# Patient Record
Sex: Female | Born: 1945 | Race: White | Hispanic: No | Marital: Single | Smoking: Never smoker
Health system: Southern US, Community
[De-identification: ages and names within clinical notes are randomized; demographics above are authoritative.]

## PROBLEM LIST (undated history)

## (undated) DIAGNOSIS — F1027 Alcohol dependence with alcohol-induced persisting dementia: Secondary | ICD-10-CM

---

## 2005-12-14 ENCOUNTER — Ambulatory Visit (HOSPITAL_COMMUNITY): Admission: RE | Admit: 2005-12-14 | Discharge: 2005-12-14 | Payer: Self-pay | Admitting: Ophthalmology

## 2005-12-29 ENCOUNTER — Ambulatory Visit (HOSPITAL_COMMUNITY): Admission: RE | Admit: 2005-12-29 | Discharge: 2005-12-29 | Payer: Self-pay | Admitting: Urology

## 2006-12-29 ENCOUNTER — Emergency Department (HOSPITAL_COMMUNITY): Admission: EM | Admit: 2006-12-29 | Discharge: 2006-12-29 | Payer: Self-pay | Admitting: Emergency Medicine

## 2010-07-10 NOTE — Procedures (Signed)
Katherine Parsons, Katherine Parsons                 ACCOUNT NO.:  [id number]   MEDICAL RECORD NO.:  [id number]          PATIENT TYPE:  AMB   LOCATION:  DAY                           FACILITY:  [facility]   PHYSICIAN:  Edward L. Juanetta Gosling, M.D.DATE OF BIRTH:  May 08, 1945   DATE OF PROCEDURE:  12/10/2005  DATE OF DISCHARGE:                                EKG INTERPRETATION   The rhythm is sinus rhythm with a rate in the 70s.  There is left atrial  enlargement.  There are T-wave abnormalities seen anteriorly mostly, which  could indicate ischemia.  Clinical correlation is suggested.  Abnormal  electrocardiogram.      Oneal Deputy. Juanetta Gosling, M.D.  Electronically Signed     [facility]/MEDQ  D:  12/13/2005  T:  12/13/2005  Job:  [id number]

## 2010-12-01 LAB — I-STAT 8, (EC8 V) (CONVERTED LAB)
Acid-Base Excess: 2
BUN: 4 — ABNORMAL LOW
Chloride: 104
Glucose, Bld: 86
HCT: 43
Operator id: 247071
Potassium: 4
Sodium: 139
TCO2: 29
pCO2, Ven: 46.1
pH, Ven: 7.385 — ABNORMAL HIGH

## 2010-12-01 LAB — CBC
Hemoglobin: 13
MCHC: 33.5
RBC: 3.57 — ABNORMAL LOW
WBC: 4.2

## 2010-12-01 LAB — DIFFERENTIAL
Lymphocytes Relative: 31
Monocytes Absolute: 0.6

## 2015-06-02 DIAGNOSIS — Z79899 Other long term (current) drug therapy: Secondary | ICD-10-CM | POA: Diagnosis not present

## 2015-06-02 DIAGNOSIS — J029 Acute pharyngitis, unspecified: Secondary | ICD-10-CM | POA: Diagnosis not present

## 2015-06-02 DIAGNOSIS — J302 Other seasonal allergic rhinitis: Secondary | ICD-10-CM | POA: Diagnosis not present

## 2015-06-02 DIAGNOSIS — E785 Hyperlipidemia, unspecified: Secondary | ICD-10-CM | POA: Diagnosis not present

## 2015-06-02 DIAGNOSIS — F329 Major depressive disorder, single episode, unspecified: Secondary | ICD-10-CM | POA: Diagnosis not present

## 2015-06-02 DIAGNOSIS — R070 Pain in throat: Secondary | ICD-10-CM | POA: Diagnosis not present

## 2015-09-16 DIAGNOSIS — M21611 Bunion of right foot: Secondary | ICD-10-CM | POA: Diagnosis not present

## 2015-09-16 DIAGNOSIS — Z681 Body mass index (BMI) 19 or less, adult: Secondary | ICD-10-CM | POA: Diagnosis not present

## 2015-09-16 DIAGNOSIS — M21612 Bunion of left foot: Secondary | ICD-10-CM | POA: Diagnosis not present

## 2015-09-19 DIAGNOSIS — Z1211 Encounter for screening for malignant neoplasm of colon: Secondary | ICD-10-CM | POA: Diagnosis not present

## 2015-09-19 DIAGNOSIS — Z1231 Encounter for screening mammogram for malignant neoplasm of breast: Secondary | ICD-10-CM | POA: Diagnosis not present

## 2015-09-19 DIAGNOSIS — Z681 Body mass index (BMI) 19 or less, adult: Secondary | ICD-10-CM | POA: Diagnosis not present

## 2015-09-19 DIAGNOSIS — E7801 Familial hypercholesterolemia: Secondary | ICD-10-CM | POA: Diagnosis not present

## 2015-09-19 DIAGNOSIS — Z0000 Encounter for general adult medical examination without abnormal findings: Secondary | ICD-10-CM | POA: Diagnosis not present

## 2015-09-19 DIAGNOSIS — Z23 Encounter for immunization: Secondary | ICD-10-CM | POA: Diagnosis not present

## 2015-09-19 DIAGNOSIS — I10 Essential (primary) hypertension: Secondary | ICD-10-CM | POA: Diagnosis not present

## 2015-09-19 DIAGNOSIS — Z124 Encounter for screening for malignant neoplasm of cervix: Secondary | ICD-10-CM | POA: Diagnosis not present

## 2015-09-19 DIAGNOSIS — E559 Vitamin D deficiency, unspecified: Secondary | ICD-10-CM | POA: Diagnosis not present

## 2015-09-19 DIAGNOSIS — L719 Rosacea, unspecified: Secondary | ICD-10-CM | POA: Diagnosis not present

## 2015-09-29 DIAGNOSIS — M71572 Other bursitis, not elsewhere classified, left ankle and foot: Secondary | ICD-10-CM | POA: Diagnosis not present

## 2015-09-29 DIAGNOSIS — M79672 Pain in left foot: Secondary | ICD-10-CM | POA: Diagnosis not present

## 2015-09-29 DIAGNOSIS — M71571 Other bursitis, not elsewhere classified, right ankle and foot: Secondary | ICD-10-CM | POA: Diagnosis not present

## 2015-09-29 DIAGNOSIS — M79671 Pain in right foot: Secondary | ICD-10-CM | POA: Diagnosis not present

## 2015-09-29 DIAGNOSIS — M8700 Idiopathic aseptic necrosis of unspecified bone: Secondary | ICD-10-CM | POA: Diagnosis not present

## 2015-10-20 DIAGNOSIS — Z23 Encounter for immunization: Secondary | ICD-10-CM | POA: Diagnosis not present

## 2016-12-17 DIAGNOSIS — Z299 Encounter for prophylactic measures, unspecified: Secondary | ICD-10-CM | POA: Diagnosis not present

## 2016-12-17 DIAGNOSIS — F0280 Dementia in other diseases classified elsewhere without behavioral disturbance: Secondary | ICD-10-CM | POA: Diagnosis not present

## 2016-12-17 DIAGNOSIS — Z23 Encounter for immunization: Secondary | ICD-10-CM | POA: Diagnosis not present

## 2016-12-17 DIAGNOSIS — R32 Unspecified urinary incontinence: Secondary | ICD-10-CM | POA: Diagnosis not present

## 2016-12-17 DIAGNOSIS — G4700 Insomnia, unspecified: Secondary | ICD-10-CM | POA: Diagnosis not present

## 2016-12-17 DIAGNOSIS — F419 Anxiety disorder, unspecified: Secondary | ICD-10-CM | POA: Diagnosis not present

## 2016-12-17 DIAGNOSIS — Z681 Body mass index (BMI) 19 or less, adult: Secondary | ICD-10-CM | POA: Diagnosis not present

## 2016-12-17 DIAGNOSIS — G309 Alzheimer's disease, unspecified: Secondary | ICD-10-CM | POA: Diagnosis not present

## 2016-12-17 DIAGNOSIS — H9190 Unspecified hearing loss, unspecified ear: Secondary | ICD-10-CM | POA: Diagnosis not present

## 2016-12-17 DIAGNOSIS — Z789 Other specified health status: Secondary | ICD-10-CM | POA: Diagnosis not present

## 2016-12-17 DIAGNOSIS — E7800 Pure hypercholesterolemia, unspecified: Secondary | ICD-10-CM | POA: Diagnosis not present

## 2017-01-07 DIAGNOSIS — G309 Alzheimer's disease, unspecified: Secondary | ICD-10-CM | POA: Diagnosis not present

## 2017-01-07 DIAGNOSIS — R5383 Other fatigue: Secondary | ICD-10-CM | POA: Diagnosis not present

## 2017-01-07 DIAGNOSIS — Z1211 Encounter for screening for malignant neoplasm of colon: Secondary | ICD-10-CM | POA: Diagnosis not present

## 2017-01-07 DIAGNOSIS — Z299 Encounter for prophylactic measures, unspecified: Secondary | ICD-10-CM | POA: Diagnosis not present

## 2017-01-07 DIAGNOSIS — Z681 Body mass index (BMI) 19 or less, adult: Secondary | ICD-10-CM | POA: Diagnosis not present

## 2017-01-07 DIAGNOSIS — Z1331 Encounter for screening for depression: Secondary | ICD-10-CM | POA: Diagnosis not present

## 2017-01-07 DIAGNOSIS — E7800 Pure hypercholesterolemia, unspecified: Secondary | ICD-10-CM | POA: Diagnosis not present

## 2017-01-07 DIAGNOSIS — Z1339 Encounter for screening examination for other mental health and behavioral disorders: Secondary | ICD-10-CM | POA: Diagnosis not present

## 2017-01-07 DIAGNOSIS — F419 Anxiety disorder, unspecified: Secondary | ICD-10-CM | POA: Diagnosis not present

## 2017-01-07 DIAGNOSIS — Z0000 Encounter for general adult medical examination without abnormal findings: Secondary | ICD-10-CM | POA: Diagnosis not present

## 2017-01-07 DIAGNOSIS — Z7189 Other specified counseling: Secondary | ICD-10-CM | POA: Diagnosis not present

## 2017-01-07 DIAGNOSIS — F0280 Dementia in other diseases classified elsewhere without behavioral disturbance: Secondary | ICD-10-CM | POA: Diagnosis not present

## 2017-02-10 DIAGNOSIS — E2839 Other primary ovarian failure: Secondary | ICD-10-CM | POA: Diagnosis not present

## 2017-03-01 DIAGNOSIS — F0280 Dementia in other diseases classified elsewhere without behavioral disturbance: Secondary | ICD-10-CM | POA: Diagnosis not present

## 2017-03-01 DIAGNOSIS — Z299 Encounter for prophylactic measures, unspecified: Secondary | ICD-10-CM | POA: Diagnosis not present

## 2017-03-01 DIAGNOSIS — F419 Anxiety disorder, unspecified: Secondary | ICD-10-CM | POA: Diagnosis not present

## 2017-03-01 DIAGNOSIS — Z681 Body mass index (BMI) 19 or less, adult: Secondary | ICD-10-CM | POA: Diagnosis not present

## 2017-03-01 DIAGNOSIS — G309 Alzheimer's disease, unspecified: Secondary | ICD-10-CM | POA: Diagnosis not present

## 2017-03-01 DIAGNOSIS — Z87891 Personal history of nicotine dependence: Secondary | ICD-10-CM | POA: Diagnosis not present

## 2017-03-01 DIAGNOSIS — M810 Age-related osteoporosis without current pathological fracture: Secondary | ICD-10-CM | POA: Diagnosis not present

## 2017-04-29 DIAGNOSIS — R32 Unspecified urinary incontinence: Secondary | ICD-10-CM | POA: Diagnosis not present

## 2017-04-29 DIAGNOSIS — Z299 Encounter for prophylactic measures, unspecified: Secondary | ICD-10-CM | POA: Diagnosis not present

## 2017-04-29 DIAGNOSIS — Z681 Body mass index (BMI) 19 or less, adult: Secondary | ICD-10-CM | POA: Diagnosis not present

## 2017-04-29 DIAGNOSIS — M810 Age-related osteoporosis without current pathological fracture: Secondary | ICD-10-CM | POA: Diagnosis not present

## 2017-04-29 DIAGNOSIS — Z789 Other specified health status: Secondary | ICD-10-CM | POA: Diagnosis not present

## 2017-04-29 DIAGNOSIS — F0280 Dementia in other diseases classified elsewhere without behavioral disturbance: Secondary | ICD-10-CM | POA: Diagnosis not present

## 2017-04-29 DIAGNOSIS — G309 Alzheimer's disease, unspecified: Secondary | ICD-10-CM | POA: Diagnosis not present

## 2017-07-12 DIAGNOSIS — Z299 Encounter for prophylactic measures, unspecified: Secondary | ICD-10-CM | POA: Diagnosis not present

## 2017-07-12 DIAGNOSIS — G309 Alzheimer's disease, unspecified: Secondary | ICD-10-CM | POA: Diagnosis not present

## 2017-07-12 DIAGNOSIS — F0280 Dementia in other diseases classified elsewhere without behavioral disturbance: Secondary | ICD-10-CM | POA: Diagnosis not present

## 2017-07-12 DIAGNOSIS — E7800 Pure hypercholesterolemia, unspecified: Secondary | ICD-10-CM | POA: Diagnosis not present

## 2017-07-12 DIAGNOSIS — M545 Low back pain: Secondary | ICD-10-CM | POA: Diagnosis not present

## 2017-07-12 DIAGNOSIS — N390 Urinary tract infection, site not specified: Secondary | ICD-10-CM | POA: Diagnosis not present

## 2017-07-12 DIAGNOSIS — Z681 Body mass index (BMI) 19 or less, adult: Secondary | ICD-10-CM | POA: Diagnosis not present

## 2017-08-24 DIAGNOSIS — F0280 Dementia in other diseases classified elsewhere without behavioral disturbance: Secondary | ICD-10-CM | POA: Diagnosis not present

## 2017-08-24 DIAGNOSIS — R32 Unspecified urinary incontinence: Secondary | ICD-10-CM | POA: Diagnosis not present

## 2017-08-24 DIAGNOSIS — Z681 Body mass index (BMI) 19 or less, adult: Secondary | ICD-10-CM | POA: Diagnosis not present

## 2017-08-24 DIAGNOSIS — Z299 Encounter for prophylactic measures, unspecified: Secondary | ICD-10-CM | POA: Diagnosis not present

## 2017-08-24 DIAGNOSIS — G309 Alzheimer's disease, unspecified: Secondary | ICD-10-CM | POA: Diagnosis not present

## 2017-08-24 DIAGNOSIS — L84 Corns and callosities: Secondary | ICD-10-CM | POA: Diagnosis not present

## 2017-09-02 ENCOUNTER — Other Ambulatory Visit: Payer: Self-pay

## 2017-09-02 ENCOUNTER — Encounter (HOSPITAL_COMMUNITY): Payer: Self-pay | Admitting: Emergency Medicine

## 2017-09-02 ENCOUNTER — Emergency Department (HOSPITAL_COMMUNITY)
Admission: EM | Admit: 2017-09-02 | Discharge: 2017-09-02 | Disposition: A | Discharge status: Home / Self Care | Payer: Medicare Other | Attending: Emergency Medicine | Admitting: Emergency Medicine

## 2017-09-02 DIAGNOSIS — M2040 Other hammer toe(s) (acquired), unspecified foot: Secondary | ICD-10-CM | POA: Diagnosis not present

## 2017-09-02 DIAGNOSIS — F1027 Alcohol dependence with alcohol-induced persisting dementia: Secondary | ICD-10-CM | POA: Insufficient documentation

## 2017-09-02 DIAGNOSIS — M79675 Pain in left toe(s): Secondary | ICD-10-CM | POA: Diagnosis not present

## 2017-09-02 HISTORY — DX: Alcohol dependence with alcohol-induced persisting dementia: F10.27

## 2017-09-02 MED ORDER — NAPROXEN 375 MG PO TABS: 375.0000 mg | ORAL_TABLET | Freq: Two times a day (BID) | ORAL | 0 refills | Status: AC

## 2017-09-02 NOTE — Discharge Instructions (Addendum)
Call one of the foot doctors listed to arrange a follow-up appt.  Keep your toes bandaged

## 2017-09-02 NOTE — ED Triage Notes (Signed)
Pt c/o of "calus" between toes on left foot x 2 weeks

## 2017-09-02 NOTE — ED Notes (Signed)
Area cushioned with abd pad.

## 2017-09-04 NOTE — ED Provider Notes (Signed)
[facility] EMERGENCY DEPARTMENT Provider Note   CSN: [id number] Arrival date & time: 09/02/17  1901     History   Chief Complaint Chief Complaint  Patient presents with  . Toe Pain    HPI Katherine Parsons is a 72 y.o. female.  HPI  Katherine Parsons is a 72 y.o. female who presents to the Emergency Department complaining of pain to her left great and second toes. Symptoms present for 2 weeks and worse with weight bearing.  She was seen by her PCP for same and referred to a podiatrist, but patient's daughter brought her to the ER requesting pain control until her mother's appointment.    Past Medical History:  Diagnosis Date  . 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   History reviewed. No pertinent surgical history.   OB History   None      Home Medications    Prior to Admission medications   Medication Sig Start Date End Date Taking? Authorizing Provider  naproxen (NAPROSYN) 375 MG tablet Take 1 tablet (375 mg total) by mouth 2 (two) times daily. Take with food 09/02/17   Pauline Ausriplett, Ilea Hilton, PA-C    Family History History reviewed. No pertinent family history.  Social History Social History   Tobacco Use  . Smoking status: Never Smoker  . Smokeless tobacco: Never Used  Substance Use Topics  . Alcohol use: Not Currently  . Drug use: Not Currently     Allergies   Patient has no known allergies.   Review of Systems Review of Systems  Constitutional: Negative for chills and fever.  Musculoskeletal: Positive for arthralgias. Negative for joint swelling.  Skin: Negative for color change and wound.  Neurological: Negative for weakness and numbness.     Physical Exam Updated Vital Signs BP 131/84 (BP Location: Right Arm)   Pulse 76   Temp 98.5 F (36.9 C) (Temporal)   Resp 18   Ht 5\' 3"  (1.6 m)   Wt 48.1 kg (106 lb)   SpO2 100%    BMI 18.78 kg/m   Physical Exam  Constitutional: She appears well-developed and well-nourished. No distress.  HENT:  Head: Atraumatic.  Cardiovascular: Normal rate, regular rhythm and intact distal pulses.  Pulmonary/Chest: Effort normal and breath sounds normal. No respiratory distress.  Musculoskeletal: Normal range of motion.  1 cm flesh colored, hyperkeratotic papule to the dorsal left second toe. Great toe is overlapping second toe.  No edema or erythema of either toe.  Hammer toes of second, third and fourth toes.   Neurological: She is alert. No sensory deficit.  Skin: Skin is warm. Capillary refill takes less than 2 seconds. No rash noted. No erythema.  Nursing note and vitals reviewed.    ED Treatments / Results  Labs (all labs ordered are listed, but only abnormal results are displayed) Labs Reviewed - No data to display  EKG None  Radiology No results found.  Procedures Procedures (including critical care time)  Medications Ordered in ED Medications - No data to display   Initial Impression / Assessment and Plan / ED Course  I have reviewed the triage vital signs and the nursing notes.  Pertinent labs & imaging results that were available during my care of the patient were reviewed by me and considered in my medical decision making (see chart for details).     hyperkeratotic papule to dorsal  second toe likely related to friction from overlapped toes.  No concerning sx's for infection, no open wounds or erythema.  NV intact.    Toes padded and taped.  Pt also provided referral to local podiatry.    Final Clinical Impressions(s) / ED Diagnoses   Final diagnoses:  Toe pain, left    ED Discharge Orders        Ordered    naproxen (NAPROSYN) 375 MG tablet  2 times daily     09/02/17 1943       Pauline Aus, PA-C 09/04/17 1943    Samuel Jester, DO 09/07/17 [phone]

## 2017-09-22 DIAGNOSIS — M79675 Pain in left toe(s): Secondary | ICD-10-CM | POA: Diagnosis not present

## 2017-09-22 DIAGNOSIS — L89893 Pressure ulcer of other site, stage 3: Secondary | ICD-10-CM | POA: Diagnosis not present

## 2017-10-25 DIAGNOSIS — Z681 Body mass index (BMI) 19 or less, adult: Secondary | ICD-10-CM | POA: Diagnosis not present

## 2017-10-25 DIAGNOSIS — Z299 Encounter for prophylactic measures, unspecified: Secondary | ICD-10-CM | POA: Diagnosis not present

## 2017-10-25 DIAGNOSIS — F0280 Dementia in other diseases classified elsewhere without behavioral disturbance: Secondary | ICD-10-CM | POA: Diagnosis not present

## 2017-10-25 DIAGNOSIS — G309 Alzheimer's disease, unspecified: Secondary | ICD-10-CM | POA: Diagnosis not present

## 2017-10-25 DIAGNOSIS — E7800 Pure hypercholesterolemia, unspecified: Secondary | ICD-10-CM | POA: Diagnosis not present

## 2017-10-25 DIAGNOSIS — F419 Anxiety disorder, unspecified: Secondary | ICD-10-CM | POA: Diagnosis not present

## 2017-10-27 DIAGNOSIS — E1140 Type 2 diabetes mellitus with diabetic neuropathy, unspecified: Secondary | ICD-10-CM | POA: Diagnosis not present

## 2017-10-27 DIAGNOSIS — L89892 Pressure ulcer of other site, stage 2: Secondary | ICD-10-CM | POA: Diagnosis not present

## 2018-01-11 DIAGNOSIS — R5383 Other fatigue: Secondary | ICD-10-CM | POA: Diagnosis not present

## 2018-01-11 DIAGNOSIS — Z681 Body mass index (BMI) 19 or less, adult: Secondary | ICD-10-CM | POA: Diagnosis not present

## 2018-01-11 DIAGNOSIS — Z1211 Encounter for screening for malignant neoplasm of colon: Secondary | ICD-10-CM | POA: Diagnosis not present

## 2018-01-11 DIAGNOSIS — Z1331 Encounter for screening for depression: Secondary | ICD-10-CM | POA: Diagnosis not present

## 2018-01-11 DIAGNOSIS — Z299 Encounter for prophylactic measures, unspecified: Secondary | ICD-10-CM | POA: Diagnosis not present

## 2018-01-11 DIAGNOSIS — E7800 Pure hypercholesterolemia, unspecified: Secondary | ICD-10-CM | POA: Diagnosis not present

## 2018-01-11 DIAGNOSIS — E559 Vitamin D deficiency, unspecified: Secondary | ICD-10-CM | POA: Diagnosis not present

## 2018-01-11 DIAGNOSIS — Z7189 Other specified counseling: Secondary | ICD-10-CM | POA: Diagnosis not present

## 2018-01-11 DIAGNOSIS — Z79899 Other long term (current) drug therapy: Secondary | ICD-10-CM | POA: Diagnosis not present

## 2018-01-11 DIAGNOSIS — Z0000 Encounter for general adult medical examination without abnormal findings: Secondary | ICD-10-CM | POA: Diagnosis not present

## 2018-01-11 DIAGNOSIS — Z1339 Encounter for screening examination for other mental health and behavioral disorders: Secondary | ICD-10-CM | POA: Diagnosis not present

## 2018-01-11 DIAGNOSIS — K5900 Constipation, unspecified: Secondary | ICD-10-CM | POA: Diagnosis not present

## 2018-03-15 DIAGNOSIS — G309 Alzheimer's disease, unspecified: Secondary | ICD-10-CM | POA: Diagnosis not present

## 2018-03-15 DIAGNOSIS — Z789 Other specified health status: Secondary | ICD-10-CM | POA: Diagnosis not present

## 2018-03-15 DIAGNOSIS — Z681 Body mass index (BMI) 19 or less, adult: Secondary | ICD-10-CM | POA: Diagnosis not present

## 2018-03-15 DIAGNOSIS — F0280 Dementia in other diseases classified elsewhere without behavioral disturbance: Secondary | ICD-10-CM | POA: Diagnosis not present

## 2018-03-15 DIAGNOSIS — Z2821 Immunization not carried out because of patient refusal: Secondary | ICD-10-CM | POA: Diagnosis not present

## 2018-03-15 DIAGNOSIS — I739 Peripheral vascular disease, unspecified: Secondary | ICD-10-CM | POA: Diagnosis not present

## 2018-03-15 DIAGNOSIS — Z299 Encounter for prophylactic measures, unspecified: Secondary | ICD-10-CM | POA: Diagnosis not present

## 2018-03-15 DIAGNOSIS — E7800 Pure hypercholesterolemia, unspecified: Secondary | ICD-10-CM | POA: Diagnosis not present

## 2018-04-03 DIAGNOSIS — I70203 Unspecified atherosclerosis of native arteries of extremities, bilateral legs: Secondary | ICD-10-CM | POA: Diagnosis not present

## 2018-04-03 DIAGNOSIS — I70211 Atherosclerosis of native arteries of extremities with intermittent claudication, right leg: Secondary | ICD-10-CM | POA: Diagnosis not present

## 2018-04-17 DIAGNOSIS — F0280 Dementia in other diseases classified elsewhere without behavioral disturbance: Secondary | ICD-10-CM | POA: Diagnosis not present

## 2018-04-17 DIAGNOSIS — G309 Alzheimer's disease, unspecified: Secondary | ICD-10-CM | POA: Diagnosis not present

## 2018-04-17 DIAGNOSIS — E7800 Pure hypercholesterolemia, unspecified: Secondary | ICD-10-CM | POA: Diagnosis not present

## 2018-04-17 DIAGNOSIS — Z681 Body mass index (BMI) 19 or less, adult: Secondary | ICD-10-CM | POA: Diagnosis not present

## 2018-04-17 DIAGNOSIS — I739 Peripheral vascular disease, unspecified: Secondary | ICD-10-CM | POA: Diagnosis not present

## 2018-04-17 DIAGNOSIS — Z299 Encounter for prophylactic measures, unspecified: Secondary | ICD-10-CM | POA: Diagnosis not present

## 2018-05-25 DIAGNOSIS — Z299 Encounter for prophylactic measures, unspecified: Secondary | ICD-10-CM | POA: Diagnosis not present

## 2018-05-25 DIAGNOSIS — G309 Alzheimer's disease, unspecified: Secondary | ICD-10-CM | POA: Diagnosis not present

## 2018-05-25 DIAGNOSIS — I739 Peripheral vascular disease, unspecified: Secondary | ICD-10-CM | POA: Diagnosis not present

## 2018-05-25 DIAGNOSIS — F0280 Dementia in other diseases classified elsewhere without behavioral disturbance: Secondary | ICD-10-CM | POA: Diagnosis not present

## 2018-05-25 DIAGNOSIS — E7800 Pure hypercholesterolemia, unspecified: Secondary | ICD-10-CM | POA: Diagnosis not present

## 2018-08-16 DIAGNOSIS — E7800 Pure hypercholesterolemia, unspecified: Secondary | ICD-10-CM | POA: Diagnosis not present

## 2018-08-16 DIAGNOSIS — F0280 Dementia in other diseases classified elsewhere without behavioral disturbance: Secondary | ICD-10-CM | POA: Diagnosis not present

## 2018-08-16 DIAGNOSIS — Z299 Encounter for prophylactic measures, unspecified: Secondary | ICD-10-CM | POA: Diagnosis not present

## 2018-08-16 DIAGNOSIS — Z681 Body mass index (BMI) 19 or less, adult: Secondary | ICD-10-CM | POA: Diagnosis not present

## 2018-08-16 DIAGNOSIS — I739 Peripheral vascular disease, unspecified: Secondary | ICD-10-CM | POA: Diagnosis not present

## 2018-08-16 DIAGNOSIS — G309 Alzheimer's disease, unspecified: Secondary | ICD-10-CM | POA: Diagnosis not present

## 2018-10-17 DIAGNOSIS — I739 Peripheral vascular disease, unspecified: Secondary | ICD-10-CM | POA: Diagnosis not present

## 2018-10-17 DIAGNOSIS — G309 Alzheimer's disease, unspecified: Secondary | ICD-10-CM | POA: Diagnosis not present

## 2018-10-17 DIAGNOSIS — F0280 Dementia in other diseases classified elsewhere without behavioral disturbance: Secondary | ICD-10-CM | POA: Diagnosis not present

## 2018-10-17 DIAGNOSIS — F419 Anxiety disorder, unspecified: Secondary | ICD-10-CM | POA: Diagnosis not present

## 2018-10-17 DIAGNOSIS — Z299 Encounter for prophylactic measures, unspecified: Secondary | ICD-10-CM | POA: Diagnosis not present

## 2018-10-17 DIAGNOSIS — Z681 Body mass index (BMI) 19 or less, adult: Secondary | ICD-10-CM | POA: Diagnosis not present

## 2018-12-18 DIAGNOSIS — R1011 Right upper quadrant pain: Secondary | ICD-10-CM | POA: Diagnosis not present

## 2018-12-18 DIAGNOSIS — F0280 Dementia in other diseases classified elsewhere without behavioral disturbance: Secondary | ICD-10-CM | POA: Diagnosis not present

## 2018-12-18 DIAGNOSIS — K5900 Constipation, unspecified: Secondary | ICD-10-CM | POA: Diagnosis not present

## 2018-12-18 DIAGNOSIS — G309 Alzheimer's disease, unspecified: Secondary | ICD-10-CM | POA: Diagnosis not present

## 2018-12-18 DIAGNOSIS — Z299 Encounter for prophylactic measures, unspecified: Secondary | ICD-10-CM | POA: Diagnosis not present

## 2018-12-18 DIAGNOSIS — Z681 Body mass index (BMI) 19 or less, adult: Secondary | ICD-10-CM | POA: Diagnosis not present

## 2018-12-25 DIAGNOSIS — R1011 Right upper quadrant pain: Secondary | ICD-10-CM | POA: Diagnosis not present

## 2019-01-17 DIAGNOSIS — Z1211 Encounter for screening for malignant neoplasm of colon: Secondary | ICD-10-CM | POA: Diagnosis not present

## 2019-01-17 DIAGNOSIS — F0280 Dementia in other diseases classified elsewhere without behavioral disturbance: Secondary | ICD-10-CM | POA: Diagnosis not present

## 2019-01-17 DIAGNOSIS — Z0000 Encounter for general adult medical examination without abnormal findings: Secondary | ICD-10-CM | POA: Diagnosis not present

## 2019-01-17 DIAGNOSIS — Z7189 Other specified counseling: Secondary | ICD-10-CM | POA: Diagnosis not present

## 2019-01-17 DIAGNOSIS — Z299 Encounter for prophylactic measures, unspecified: Secondary | ICD-10-CM | POA: Diagnosis not present

## 2019-01-17 DIAGNOSIS — Z681 Body mass index (BMI) 19 or less, adult: Secondary | ICD-10-CM | POA: Diagnosis not present

## 2019-01-17 DIAGNOSIS — Z1339 Encounter for screening examination for other mental health and behavioral disorders: Secondary | ICD-10-CM | POA: Diagnosis not present

## 2019-01-17 DIAGNOSIS — E7800 Pure hypercholesterolemia, unspecified: Secondary | ICD-10-CM | POA: Diagnosis not present

## 2019-01-17 DIAGNOSIS — G309 Alzheimer's disease, unspecified: Secondary | ICD-10-CM | POA: Diagnosis not present

## 2019-01-17 DIAGNOSIS — Z1331 Encounter for screening for depression: Secondary | ICD-10-CM | POA: Diagnosis not present

## 2020-04-02 DIAGNOSIS — G309 Alzheimer's disease, unspecified: Secondary | ICD-10-CM | POA: Diagnosis not present

## 2020-04-02 DIAGNOSIS — D692 Other nonthrombocytopenic purpura: Secondary | ICD-10-CM | POA: Diagnosis not present

## 2020-04-02 DIAGNOSIS — Z789 Other specified health status: Secondary | ICD-10-CM | POA: Diagnosis not present

## 2020-04-02 DIAGNOSIS — F0280 Dementia in other diseases classified elsewhere without behavioral disturbance: Secondary | ICD-10-CM | POA: Diagnosis not present

## 2020-04-02 DIAGNOSIS — Z681 Body mass index (BMI) 19 or less, adult: Secondary | ICD-10-CM | POA: Diagnosis not present

## 2020-04-02 DIAGNOSIS — I739 Peripheral vascular disease, unspecified: Secondary | ICD-10-CM | POA: Diagnosis not present

## 2020-04-02 DIAGNOSIS — Z299 Encounter for prophylactic measures, unspecified: Secondary | ICD-10-CM | POA: Diagnosis not present

## 2020-07-14 DIAGNOSIS — Z299 Encounter for prophylactic measures, unspecified: Secondary | ICD-10-CM | POA: Diagnosis not present

## 2020-07-14 DIAGNOSIS — Z0000 Encounter for general adult medical examination without abnormal findings: Secondary | ICD-10-CM | POA: Diagnosis not present

## 2020-07-14 DIAGNOSIS — R5383 Other fatigue: Secondary | ICD-10-CM | POA: Diagnosis not present

## 2020-07-14 DIAGNOSIS — Z681 Body mass index (BMI) 19 or less, adult: Secondary | ICD-10-CM | POA: Diagnosis not present

## 2020-07-14 DIAGNOSIS — Z1331 Encounter for screening for depression: Secondary | ICD-10-CM | POA: Diagnosis not present

## 2020-07-14 DIAGNOSIS — Z1339 Encounter for screening examination for other mental health and behavioral disorders: Secondary | ICD-10-CM | POA: Diagnosis not present

## 2020-07-14 DIAGNOSIS — E7800 Pure hypercholesterolemia, unspecified: Secondary | ICD-10-CM | POA: Diagnosis not present

## 2020-07-14 DIAGNOSIS — Z7189 Other specified counseling: Secondary | ICD-10-CM | POA: Diagnosis not present

## 2020-07-14 DIAGNOSIS — Z79899 Other long term (current) drug therapy: Secondary | ICD-10-CM | POA: Diagnosis not present

## 2020-08-26 DIAGNOSIS — R42 Dizziness and giddiness: Secondary | ICD-10-CM | POA: Diagnosis not present

## 2020-08-26 DIAGNOSIS — R2681 Unsteadiness on feet: Secondary | ICD-10-CM | POA: Diagnosis not present

## 2020-08-26 DIAGNOSIS — Z299 Encounter for prophylactic measures, unspecified: Secondary | ICD-10-CM | POA: Diagnosis not present

## 2020-08-26 DIAGNOSIS — R609 Edema, unspecified: Secondary | ICD-10-CM | POA: Diagnosis not present

## 2020-09-09 DIAGNOSIS — D692 Other nonthrombocytopenic purpura: Secondary | ICD-10-CM | POA: Diagnosis not present

## 2020-09-09 DIAGNOSIS — Z299 Encounter for prophylactic measures, unspecified: Secondary | ICD-10-CM | POA: Diagnosis not present

## 2020-09-09 DIAGNOSIS — R609 Edema, unspecified: Secondary | ICD-10-CM | POA: Diagnosis not present

## 2020-09-09 DIAGNOSIS — F0280 Dementia in other diseases classified elsewhere without behavioral disturbance: Secondary | ICD-10-CM | POA: Diagnosis not present

## 2020-09-09 DIAGNOSIS — G309 Alzheimer's disease, unspecified: Secondary | ICD-10-CM | POA: Diagnosis not present

## 2020-09-09 DIAGNOSIS — Z681 Body mass index (BMI) 19 or less, adult: Secondary | ICD-10-CM | POA: Diagnosis not present

## 2020-09-18 DIAGNOSIS — F0280 Dementia in other diseases classified elsewhere without behavioral disturbance: Secondary | ICD-10-CM | POA: Diagnosis not present

## 2020-09-18 DIAGNOSIS — Z713 Dietary counseling and surveillance: Secondary | ICD-10-CM | POA: Diagnosis not present

## 2020-09-18 DIAGNOSIS — Z299 Encounter for prophylactic measures, unspecified: Secondary | ICD-10-CM | POA: Diagnosis not present

## 2020-09-18 DIAGNOSIS — G309 Alzheimer's disease, unspecified: Secondary | ICD-10-CM | POA: Diagnosis not present

## 2020-09-18 DIAGNOSIS — Z681 Body mass index (BMI) 19 or less, adult: Secondary | ICD-10-CM | POA: Diagnosis not present

## 2020-11-03 ENCOUNTER — Other Ambulatory Visit: Payer: Self-pay | Admitting: Internal Medicine

## 2020-12-01 ENCOUNTER — Other Ambulatory Visit: Payer: Self-pay | Admitting: Internal Medicine

## 2020-12-01 DIAGNOSIS — Z139 Encounter for screening, unspecified: Secondary | ICD-10-CM

## 2020-12-02 ENCOUNTER — Ambulatory Visit
Admission: RE | Admit: 2020-12-02 | Discharge: 2020-12-02 | Disposition: A | Discharge status: Home / Self Care | Payer: Medicaid Other | Source: Ambulatory Visit | Attending: Internal Medicine | Admitting: Internal Medicine

## 2020-12-02 ENCOUNTER — Other Ambulatory Visit: Payer: Self-pay

## 2020-12-02 DIAGNOSIS — Z1231 Encounter for screening mammogram for malignant neoplasm of breast: Secondary | ICD-10-CM | POA: Diagnosis not present

## 2020-12-02 DIAGNOSIS — Z139 Encounter for screening, unspecified: Secondary | ICD-10-CM

## 2021-04-13 DIAGNOSIS — Z789 Other specified health status: Secondary | ICD-10-CM | POA: Diagnosis not present

## 2021-04-13 DIAGNOSIS — J069 Acute upper respiratory infection, unspecified: Secondary | ICD-10-CM | POA: Diagnosis not present

## 2021-04-13 DIAGNOSIS — Z681 Body mass index (BMI) 19 or less, adult: Secondary | ICD-10-CM | POA: Diagnosis not present

## 2021-04-13 DIAGNOSIS — G309 Alzheimer's disease, unspecified: Secondary | ICD-10-CM | POA: Diagnosis not present

## 2021-04-13 DIAGNOSIS — Z299 Encounter for prophylactic measures, unspecified: Secondary | ICD-10-CM | POA: Diagnosis not present

## 2021-07-14 DIAGNOSIS — Z7189 Other specified counseling: Secondary | ICD-10-CM | POA: Diagnosis not present

## 2021-07-14 DIAGNOSIS — E559 Vitamin D deficiency, unspecified: Secondary | ICD-10-CM | POA: Diagnosis not present

## 2021-07-14 DIAGNOSIS — Z299 Encounter for prophylactic measures, unspecified: Secondary | ICD-10-CM | POA: Diagnosis not present

## 2021-07-14 DIAGNOSIS — Z0000 Encounter for general adult medical examination without abnormal findings: Secondary | ICD-10-CM | POA: Diagnosis not present

## 2021-07-14 DIAGNOSIS — Z681 Body mass index (BMI) 19 or less, adult: Secondary | ICD-10-CM | POA: Diagnosis not present

## 2021-07-14 DIAGNOSIS — R5383 Other fatigue: Secondary | ICD-10-CM | POA: Diagnosis not present

## 2021-07-14 DIAGNOSIS — Z1211 Encounter for screening for malignant neoplasm of colon: Secondary | ICD-10-CM | POA: Diagnosis not present

## 2021-07-14 DIAGNOSIS — Z79899 Other long term (current) drug therapy: Secondary | ICD-10-CM | POA: Diagnosis not present

## 2021-07-14 DIAGNOSIS — E7800 Pure hypercholesterolemia, unspecified: Secondary | ICD-10-CM | POA: Diagnosis not present

## 2021-10-30 DIAGNOSIS — E7800 Pure hypercholesterolemia, unspecified: Secondary | ICD-10-CM | POA: Diagnosis not present

## 2021-10-30 DIAGNOSIS — Z681 Body mass index (BMI) 19 or less, adult: Secondary | ICD-10-CM | POA: Diagnosis not present

## 2021-10-30 DIAGNOSIS — Z299 Encounter for prophylactic measures, unspecified: Secondary | ICD-10-CM | POA: Diagnosis not present

## 2021-10-30 DIAGNOSIS — R350 Frequency of micturition: Secondary | ICD-10-CM | POA: Diagnosis not present

## 2021-10-30 DIAGNOSIS — G309 Alzheimer's disease, unspecified: Secondary | ICD-10-CM | POA: Diagnosis not present

## 2021-10-30 DIAGNOSIS — Z0000 Encounter for general adult medical examination without abnormal findings: Secondary | ICD-10-CM | POA: Diagnosis not present

## 2022-01-29 DIAGNOSIS — Z681 Body mass index (BMI) 19 or less, adult: Secondary | ICD-10-CM | POA: Diagnosis not present

## 2022-01-29 DIAGNOSIS — Z299 Encounter for prophylactic measures, unspecified: Secondary | ICD-10-CM | POA: Diagnosis not present

## 2022-01-29 DIAGNOSIS — R509 Fever, unspecified: Secondary | ICD-10-CM | POA: Diagnosis not present

## 2022-01-29 DIAGNOSIS — G309 Alzheimer's disease, unspecified: Secondary | ICD-10-CM | POA: Diagnosis not present

## 2022-04-30 DIAGNOSIS — D692 Other nonthrombocytopenic purpura: Secondary | ICD-10-CM | POA: Diagnosis not present

## 2022-04-30 DIAGNOSIS — Z0000 Encounter for general adult medical examination without abnormal findings: Secondary | ICD-10-CM | POA: Diagnosis not present

## 2022-04-30 DIAGNOSIS — E7800 Pure hypercholesterolemia, unspecified: Secondary | ICD-10-CM | POA: Diagnosis not present

## 2022-04-30 DIAGNOSIS — Z299 Encounter for prophylactic measures, unspecified: Secondary | ICD-10-CM | POA: Diagnosis not present

## 2022-04-30 DIAGNOSIS — R5383 Other fatigue: Secondary | ICD-10-CM | POA: Diagnosis not present

## 2022-04-30 DIAGNOSIS — Z681 Body mass index (BMI) 19 or less, adult: Secondary | ICD-10-CM | POA: Diagnosis not present

## 2022-04-30 DIAGNOSIS — Z7189 Other specified counseling: Secondary | ICD-10-CM | POA: Diagnosis not present

## 2022-06-30 DIAGNOSIS — Z840 Family history of diseases of the skin and subcutaneous tissue: Secondary | ICD-10-CM | POA: Diagnosis not present

## 2022-06-30 DIAGNOSIS — L989 Disorder of the skin and subcutaneous tissue, unspecified: Secondary | ICD-10-CM | POA: Diagnosis not present

## 2022-06-30 DIAGNOSIS — K279 Peptic ulcer, site unspecified, unspecified as acute or chronic, without hemorrhage or perforation: Secondary | ICD-10-CM | POA: Diagnosis not present

## 2022-06-30 DIAGNOSIS — Z299 Encounter for prophylactic measures, unspecified: Secondary | ICD-10-CM | POA: Diagnosis not present

## 2022-07-05 DIAGNOSIS — C44329 Squamous cell carcinoma of skin of other parts of face: Secondary | ICD-10-CM | POA: Diagnosis not present

## 2022-07-05 DIAGNOSIS — D485 Neoplasm of uncertain behavior of skin: Secondary | ICD-10-CM | POA: Diagnosis not present

## 2022-07-05 DIAGNOSIS — L570 Actinic keratosis: Secondary | ICD-10-CM | POA: Diagnosis not present

## 2022-07-08 DIAGNOSIS — D485 Neoplasm of uncertain behavior of skin: Secondary | ICD-10-CM | POA: Diagnosis not present

## 2022-08-02 DIAGNOSIS — J069 Acute upper respiratory infection, unspecified: Secondary | ICD-10-CM | POA: Diagnosis not present

## 2022-08-02 DIAGNOSIS — G309 Alzheimer's disease, unspecified: Secondary | ICD-10-CM | POA: Diagnosis not present

## 2022-08-02 DIAGNOSIS — Z299 Encounter for prophylactic measures, unspecified: Secondary | ICD-10-CM | POA: Diagnosis not present

## 2022-08-02 DIAGNOSIS — Z0000 Encounter for general adult medical examination without abnormal findings: Secondary | ICD-10-CM | POA: Diagnosis not present

## 2022-08-24 DIAGNOSIS — R5383 Other fatigue: Secondary | ICD-10-CM | POA: Diagnosis not present

## 2022-08-24 DIAGNOSIS — C44320 Squamous cell carcinoma of skin of unspecified parts of face: Secondary | ICD-10-CM | POA: Diagnosis not present

## 2022-08-24 DIAGNOSIS — E7800 Pure hypercholesterolemia, unspecified: Secondary | ICD-10-CM | POA: Diagnosis not present

## 2022-08-24 DIAGNOSIS — Z79899 Other long term (current) drug therapy: Secondary | ICD-10-CM | POA: Diagnosis not present

## 2022-08-24 DIAGNOSIS — Z299 Encounter for prophylactic measures, unspecified: Secondary | ICD-10-CM | POA: Diagnosis not present

## 2022-08-24 DIAGNOSIS — E559 Vitamin D deficiency, unspecified: Secondary | ICD-10-CM | POA: Diagnosis not present

## 2022-08-24 DIAGNOSIS — R238 Other skin changes: Secondary | ICD-10-CM | POA: Diagnosis not present

## 2022-08-31 DIAGNOSIS — C44329 Squamous cell carcinoma of skin of other parts of face: Secondary | ICD-10-CM | POA: Diagnosis not present

## 2022-09-27 DIAGNOSIS — C44329 Squamous cell carcinoma of skin of other parts of face: Secondary | ICD-10-CM | POA: Diagnosis not present

## 2022-10-27 DIAGNOSIS — C44329 Squamous cell carcinoma of skin of other parts of face: Secondary | ICD-10-CM | POA: Diagnosis not present

## 2022-11-02 DIAGNOSIS — Z299 Encounter for prophylactic measures, unspecified: Secondary | ICD-10-CM | POA: Diagnosis not present

## 2022-11-02 DIAGNOSIS — G309 Alzheimer's disease, unspecified: Secondary | ICD-10-CM | POA: Diagnosis not present

## 2022-11-02 DIAGNOSIS — K5900 Constipation, unspecified: Secondary | ICD-10-CM | POA: Diagnosis not present

## 2022-11-30 DIAGNOSIS — R634 Abnormal weight loss: Secondary | ICD-10-CM | POA: Diagnosis not present

## 2022-11-30 DIAGNOSIS — Z23 Encounter for immunization: Secondary | ICD-10-CM | POA: Diagnosis not present

## 2022-11-30 DIAGNOSIS — Z299 Encounter for prophylactic measures, unspecified: Secondary | ICD-10-CM | POA: Diagnosis not present

## 2022-11-30 DIAGNOSIS — G309 Alzheimer's disease, unspecified: Secondary | ICD-10-CM | POA: Diagnosis not present

## 2022-12-21 DIAGNOSIS — C44329 Squamous cell carcinoma of skin of other parts of face: Secondary | ICD-10-CM | POA: Diagnosis not present

## 2022-12-21 DIAGNOSIS — D485 Neoplasm of uncertain behavior of skin: Secondary | ICD-10-CM | POA: Diagnosis not present

## 2023-02-02 DIAGNOSIS — G309 Alzheimer's disease, unspecified: Secondary | ICD-10-CM | POA: Diagnosis not present

## 2023-02-02 DIAGNOSIS — Z299 Encounter for prophylactic measures, unspecified: Secondary | ICD-10-CM | POA: Diagnosis not present

## 2023-02-02 DIAGNOSIS — Z681 Body mass index (BMI) 19 or less, adult: Secondary | ICD-10-CM | POA: Diagnosis not present

## 2023-04-11 DIAGNOSIS — D485 Neoplasm of uncertain behavior of skin: Secondary | ICD-10-CM | POA: Diagnosis not present

## 2023-04-11 DIAGNOSIS — C44329 Squamous cell carcinoma of skin of other parts of face: Secondary | ICD-10-CM | POA: Diagnosis not present

## 2023-04-11 DIAGNOSIS — L578 Other skin changes due to chronic exposure to nonionizing radiation: Secondary | ICD-10-CM | POA: Diagnosis not present

## 2023-04-11 DIAGNOSIS — L570 Actinic keratosis: Secondary | ICD-10-CM | POA: Diagnosis not present

## 2023-05-04 DIAGNOSIS — Z0000 Encounter for general adult medical examination without abnormal findings: Secondary | ICD-10-CM | POA: Diagnosis not present

## 2023-05-04 DIAGNOSIS — Z299 Encounter for prophylactic measures, unspecified: Secondary | ICD-10-CM | POA: Diagnosis not present

## 2023-05-04 DIAGNOSIS — D692 Other nonthrombocytopenic purpura: Secondary | ICD-10-CM | POA: Diagnosis not present

## 2023-05-04 DIAGNOSIS — Z7189 Other specified counseling: Secondary | ICD-10-CM | POA: Diagnosis not present

## 2023-05-04 DIAGNOSIS — G309 Alzheimer's disease, unspecified: Secondary | ICD-10-CM | POA: Diagnosis not present

## 2023-05-10 IMAGING — MG MM DIGITAL SCREENING BILAT W/ TOMO AND CAD
8 series · 9 of 24 positions shown · non-contrast
Comparison: Previous exam(s).

CLINICAL DATA: Screening.

EXAM:
DIGITAL SCREENING BILATERAL MAMMOGRAM WITH TOMOSYNTHESIS AND CAD
TECHNIQUE: Bilateral screening digital craniocaudal and mediolateral oblique
mammograms were obtained. Bilateral screening digital breast
tomosynthesis was performed. The images were evaluated with
computer-aided detection.

[L CC synth-2D]
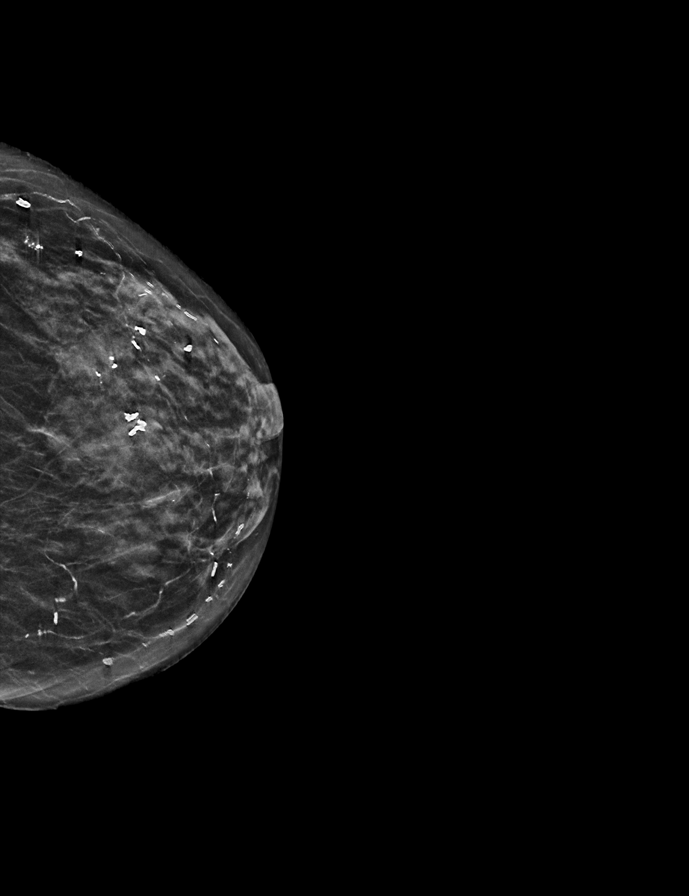

[R CC synth-2D]
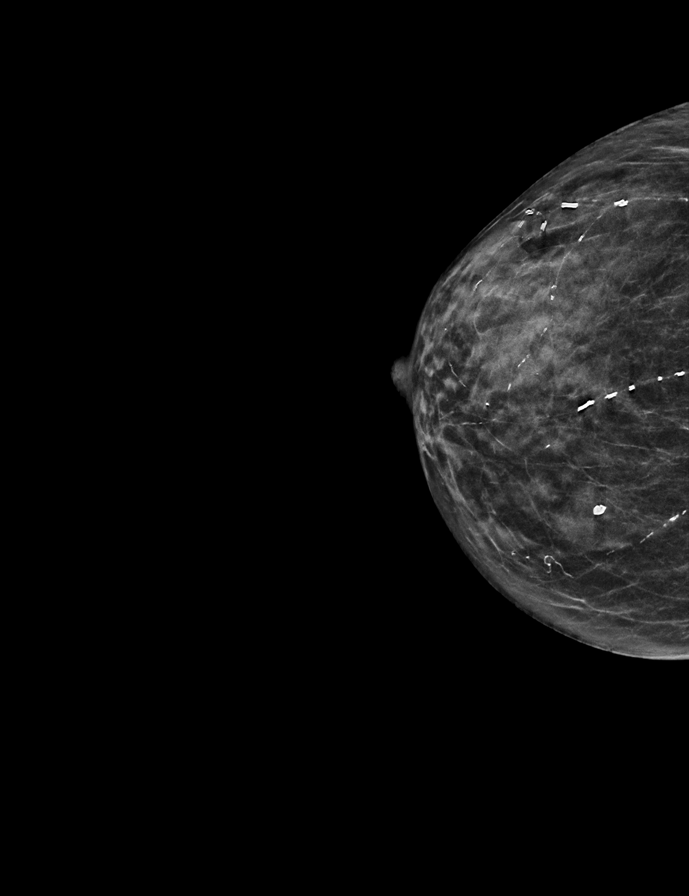

[R MLO synth-2D]
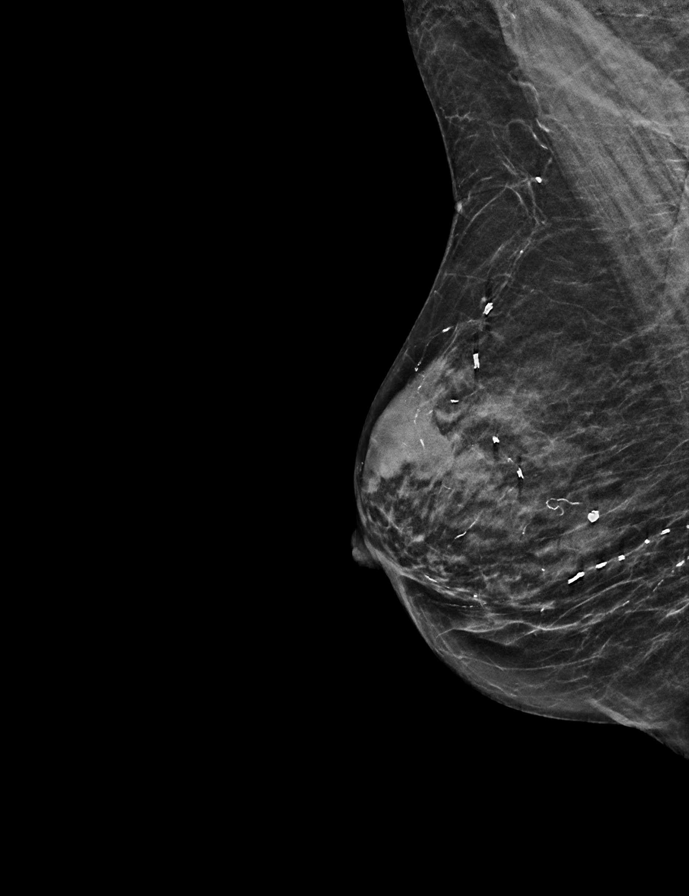

[L MLO synth-2D]
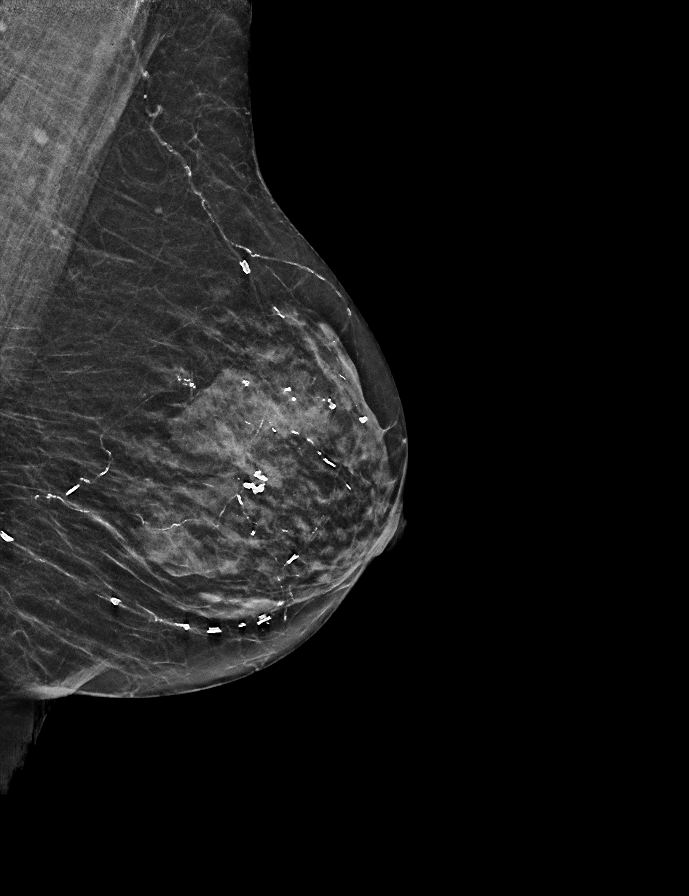

[R CC tomo · 2 of 38 frames shown]
[frame 13/38]
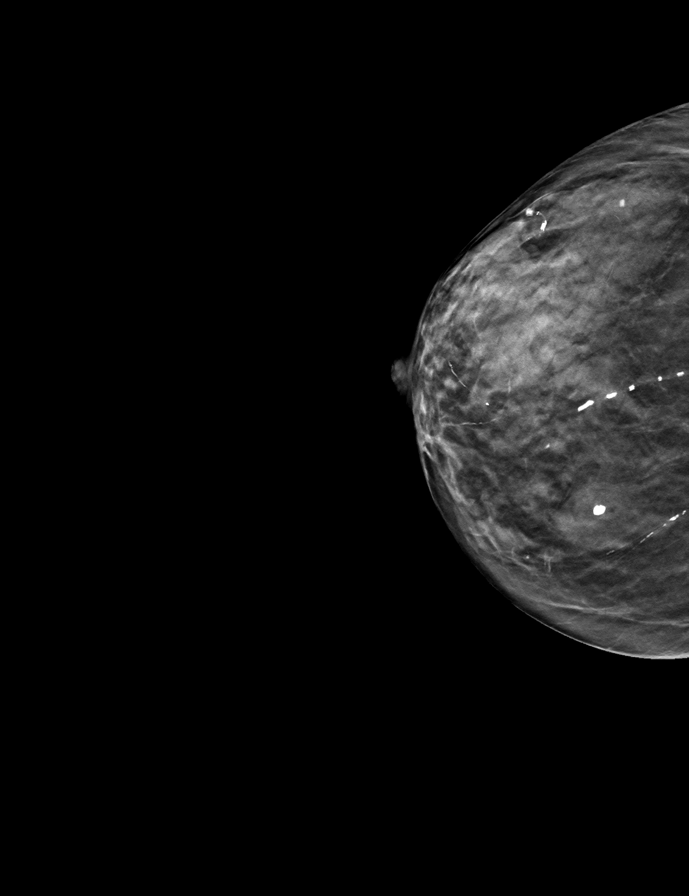
[frame 19/38]
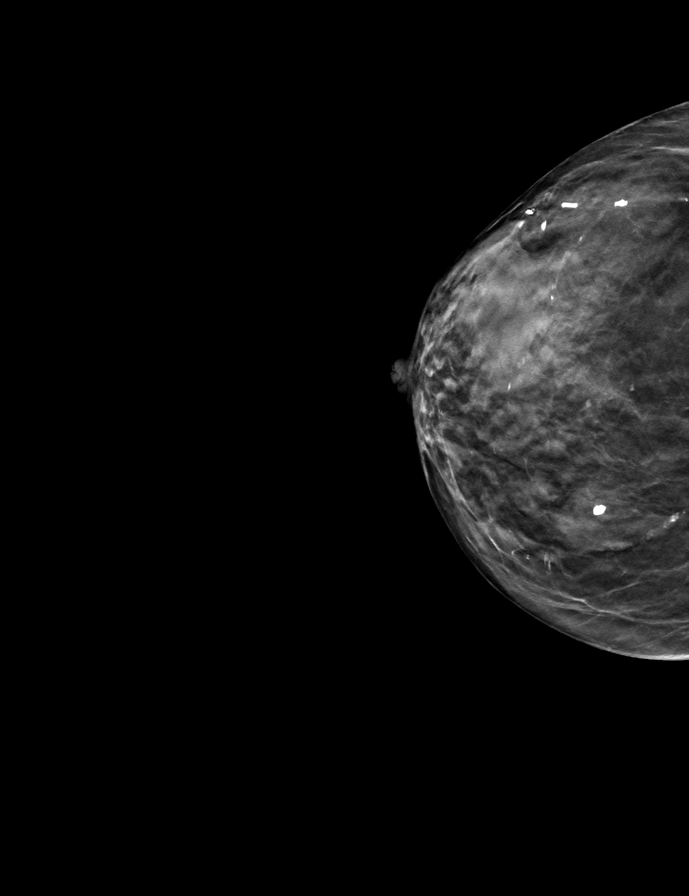

[L MLO tomo · tomo slice 17/34.0]
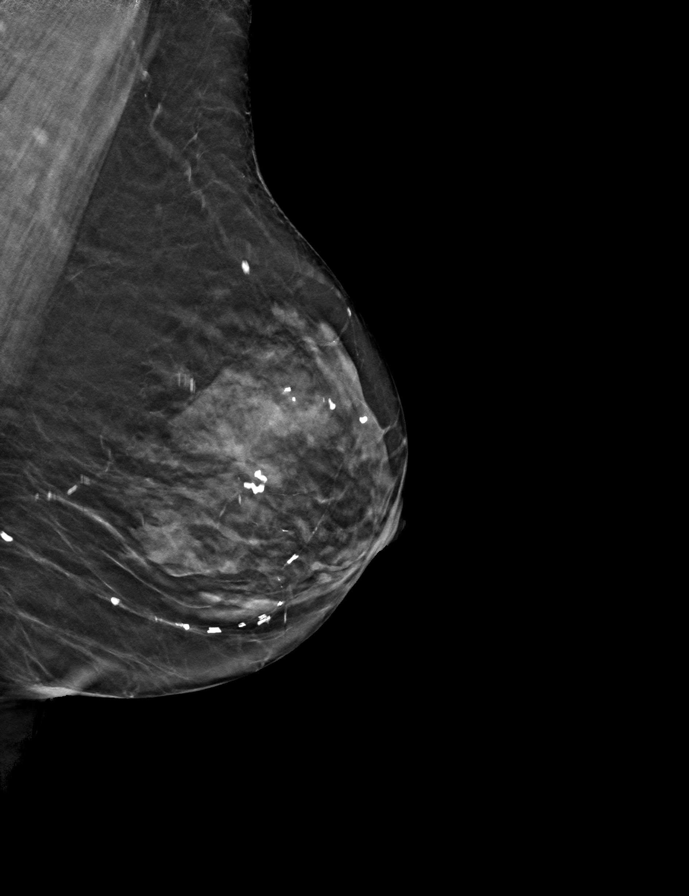

[R MLO tomo · tomo slice 18/35.0]
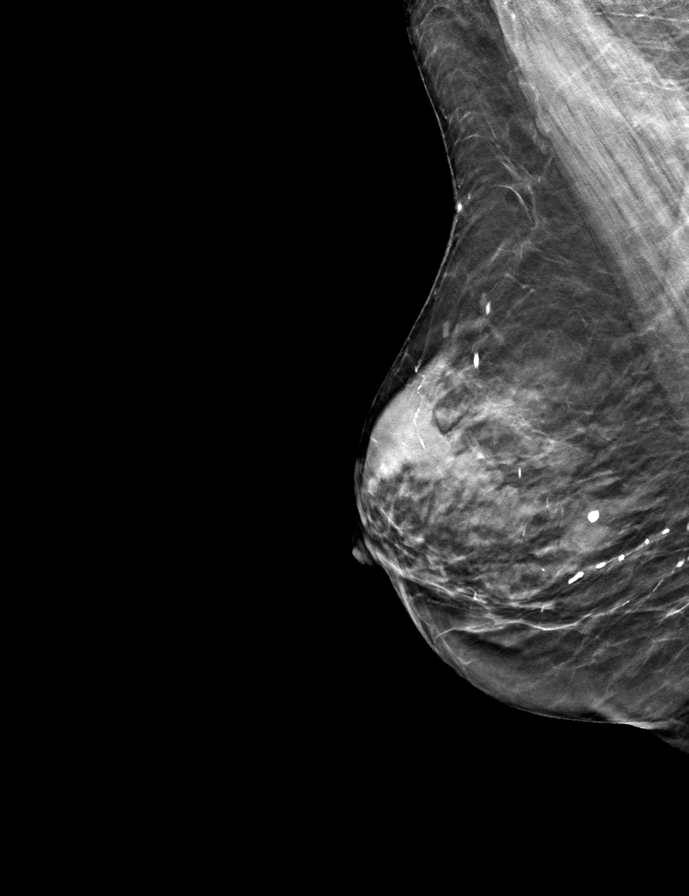

[L CC tomo · tomo slice 20/39.0]
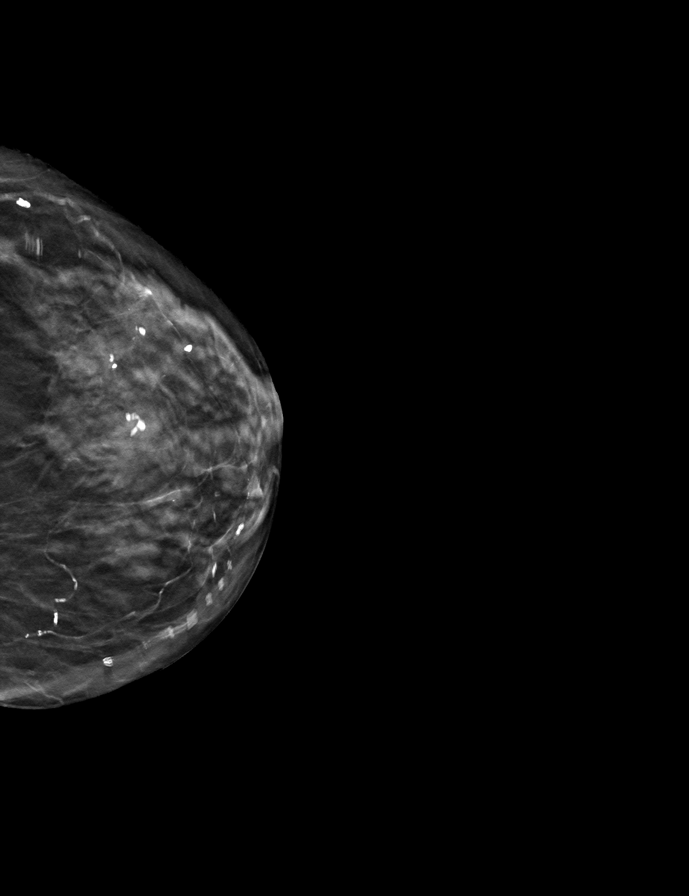

[9 of 24 positions shown; findings below may reference images not displayed]

ACR Breast Density Category c: The breast tissue is heterogeneously
dense, which may obscure small masses.
FINDINGS: There are no findings suspicious for malignancy.
IMPRESSION: No mammographic evidence of malignancy. A result letter of this
screening mammogram will be mailed directly to the patient.

RECOMMENDATION:
Screening mammogram in one year. (Code:Q3-W-BC3)

BI-RADS CATEGORY  1: Negative.

## 2023-05-11 DIAGNOSIS — C44329 Squamous cell carcinoma of skin of other parts of face: Secondary | ICD-10-CM | POA: Diagnosis not present

## 2023-08-04 DIAGNOSIS — E559 Vitamin D deficiency, unspecified: Secondary | ICD-10-CM | POA: Diagnosis not present

## 2023-08-04 DIAGNOSIS — Z0000 Encounter for general adult medical examination without abnormal findings: Secondary | ICD-10-CM | POA: Diagnosis not present

## 2023-08-04 DIAGNOSIS — E7800 Pure hypercholesterolemia, unspecified: Secondary | ICD-10-CM | POA: Diagnosis not present

## 2023-08-04 DIAGNOSIS — Z299 Encounter for prophylactic measures, unspecified: Secondary | ICD-10-CM | POA: Diagnosis not present

## 2023-08-04 DIAGNOSIS — Z79899 Other long term (current) drug therapy: Secondary | ICD-10-CM | POA: Diagnosis not present

## 2023-08-04 DIAGNOSIS — R5383 Other fatigue: Secondary | ICD-10-CM | POA: Diagnosis not present

## 2023-09-16 DIAGNOSIS — C44329 Squamous cell carcinoma of skin of other parts of face: Secondary | ICD-10-CM | POA: Diagnosis not present

## 2023-11-04 DIAGNOSIS — Z299 Encounter for prophylactic measures, unspecified: Secondary | ICD-10-CM | POA: Diagnosis not present

## 2023-11-04 DIAGNOSIS — G309 Alzheimer's disease, unspecified: Secondary | ICD-10-CM | POA: Diagnosis not present

## 2023-11-04 DIAGNOSIS — E7800 Pure hypercholesterolemia, unspecified: Secondary | ICD-10-CM | POA: Diagnosis not present

## 2023-11-04 DIAGNOSIS — Z23 Encounter for immunization: Secondary | ICD-10-CM | POA: Diagnosis not present
